# Patient Record
Sex: Male | Born: 1985 | Race: White | Hispanic: No | Marital: Single | Smoking: Never smoker
Health system: Southern US, Community
[De-identification: ages and names within clinical notes are randomized; demographics above are authoritative.]

## PROBLEM LIST (undated history)

## (undated) HISTORY — PX: OTHER SURGICAL HISTORY: SHX169

## (undated) HISTORY — PX: NEPHRECTOMY: SHX65

---

## 2005-12-29 ENCOUNTER — Emergency Department (HOSPITAL_COMMUNITY): Admission: EM | Admit: 2005-12-29 | Discharge: 2005-12-30 | Payer: Self-pay | Admitting: Emergency Medicine

## 2016-08-05 ENCOUNTER — Ambulatory Visit: Payer: Self-pay | Admitting: Family Medicine

## 2016-08-25 ENCOUNTER — Ambulatory Visit: Payer: Self-pay | Admitting: Family Medicine

## 2016-10-26 ENCOUNTER — Emergency Department (HOSPITAL_COMMUNITY)
Admission: EM | Admit: 2016-10-26 | Discharge: 2016-10-26 | Disposition: A | Discharge status: Home / Self Care | Payer: BLUE CROSS/BLUE SHIELD | Attending: Emergency Medicine | Admitting: Emergency Medicine

## 2016-10-26 ENCOUNTER — Encounter (HOSPITAL_COMMUNITY): Payer: Self-pay | Admitting: Emergency Medicine

## 2016-10-26 ENCOUNTER — Emergency Department (HOSPITAL_COMMUNITY): Payer: BLUE CROSS/BLUE SHIELD

## 2016-10-26 DIAGNOSIS — X500XXA Overexertion from strenuous movement or load, initial encounter: Secondary | ICD-10-CM | POA: Diagnosis not present

## 2016-10-26 DIAGNOSIS — Y92310 Basketball court as the place of occurrence of the external cause: Secondary | ICD-10-CM | POA: Insufficient documentation

## 2016-10-26 DIAGNOSIS — S99912A Unspecified injury of left ankle, initial encounter: Secondary | ICD-10-CM | POA: Diagnosis present

## 2016-10-26 DIAGNOSIS — Y9367 Activity, basketball: Secondary | ICD-10-CM | POA: Insufficient documentation

## 2016-10-26 DIAGNOSIS — S86012A Strain of left Achilles tendon, initial encounter: Secondary | ICD-10-CM

## 2016-10-26 DIAGNOSIS — Y998 Other external cause status: Secondary | ICD-10-CM | POA: Diagnosis not present

## 2016-10-26 MED ORDER — HYDROCODONE-ACETAMINOPHEN 5-325 MG PO TABS
1.0000 | ORAL_TABLET | Freq: Once | ORAL | Status: AC
  Administered 2016-10-26: 1 via ORAL
  Filled 2016-10-26: qty 1

## 2016-10-26 MED ORDER — ONDANSETRON 4 MG PO TBDP
4.0000 mg | ORAL_TABLET | Freq: Once | ORAL | Status: AC
  Administered 2016-10-26: 4 mg via ORAL
  Filled 2016-10-26: qty 1

## 2016-10-26 MED ORDER — ONDANSETRON HCL 4 MG PO TABS: 4.0000 mg | ORAL_TABLET | Freq: Four times a day (QID) | ORAL | 0 refills | Status: DC

## 2016-10-26 MED ORDER — MORPHINE SULFATE (PF) 4 MG/ML IV SOLN
4.0000 mg | Freq: Once | INTRAVENOUS | Status: AC
  Administered 2016-10-26: 4 mg via INTRAMUSCULAR
  Filled 2016-10-26: qty 1

## 2016-10-26 MED ORDER — HYDROCODONE-ACETAMINOPHEN 5-325 MG PO TABS: 1.0000 | ORAL_TABLET | ORAL | 0 refills | Status: DC | PRN

## 2016-10-26 MED ORDER — OXYCODONE-ACETAMINOPHEN 5-325 MG PO TABS
1.0000 | ORAL_TABLET | Freq: Once | ORAL | Status: DC
Start: 2016-10-26 — End: 2016-10-26

## 2016-10-26 NOTE — Discharge Instructions (Signed)
You may take the Norco as needed for severe pain. He may alternate with ibuprofen as needed (200-200-400 mg).  Keep ankle in splint, and use crutches. Do not put weight on her leg. Follow-up with orthopedics in the next 1-2 days. Return to the emergency department if he started to experience severe worsening of pain or loss of feeling in your foot.

## 2016-10-26 NOTE — Progress Notes (Signed)
Orthopedic Tech Progress Note Patient Details:  Aaron MohsSteven Fields 02/13/1986 [id number]  Ortho Devices Type of Ortho Device: Ace wrap, Post (short leg) splint Ortho Device/Splint Location: LLE Ortho Device/Splint Interventions: Ordered, Application   [name], [name] 10/26/2016, 9:23 PM

## 2016-10-26 NOTE — ED Provider Notes (Signed)
[facility]EMER[facility]ENCY DEPT Provider Note   CSN: [id number] Arrival date & time: 10/26/16  1907  .By signing my name below, I, Freida Busman, attest that this documentation has been prepared under the direction and in the presence of  Thia Olesen PA-C. Electronically Signed: Freida Busman, Scribe. 10/26/2016. 7:35 PM.  History   Chief Complaint Chief Complaint  Patient presents with  . Ankle Pain    The history is provided by the patient. No language interpreter was used.   HPI Comments:  Aaron Fields is a 31 y.o. male who presents to the Emergency Department complaining of moderate-severe, constant left ankle pain that began just PTA. He was playing basketball; pushed off the ground to jump and heard a very loud pop. He states his pain radiates up the left calf. He reports associated numbness/tingling in the left foot, but cannot identify where. No alleviating factors noted; no treatments tried PTA. No recent use of antibiotics or steroids. No injury or pain elsewhere.  History reviewed. No pertinent past medical history.  There are no active problems to display for this patient.   Past Surgical History:  Procedure Laterality Date  . heminephrectomy         Home Medications    Prior to Admission medications   Medication Sig Start Date End Date Taking? Authorizing Provider  HYDROcodone-acetaminophen (NORCO/VICODIN) 5-325 MG tablet Take 1-2 tablets by mouth every 4 (four) hours as needed for severe pain. 10/26/16   Theda Payer, PA-C  ondansetron (ZOFRAN) 4 MG tablet Take 1 tablet (4 mg total) by mouth every 6 (six) hours. 10/26/16   Kiandria Clum, PA-C    Family History History reviewed. No pertinent family history.  Social History Social History  Substance Use Topics  . Smoking status: Never Smoker  . Smokeless tobacco: Not on file  . Alcohol use No     Comment: occassional     Allergies   Ceclor [cefaclor]   Review of Systems Review of Systems    Musculoskeletal: Positive for arthralgias and myalgias.  Neurological: Negative for weakness.     Physical Exam Updated Vital Signs BP 136/80 (BP Location: Right Arm)   Pulse 72   Temp 98.2 F (36.8 C) (Oral)   Resp 16   Ht 6' (1.829 m)   Wt 108.9 kg (240 lb)   SpO2 100%   BMI 32.55 kg/m   Physical Exam  Constitutional: He is oriented to person, place, and time. He appears well-developed and well-nourished. No distress.  HENT:  Head: Normocephalic and atraumatic.  Eyes: Conjunctivae are normal.  Cardiovascular: Normal rate.   Pulmonary/Chest: Effort normal.  Abdominal: He exhibits no distension.  Musculoskeletal:  Minimal swelling of L ankle. No obsvious contusions or laceration. TTP of L calf with increased tenderness and a bulge about 5 in above the ankle. No palpable L achilles tendon, obvious palpable right achilles tendon.  Sensation intact bilaterally. Pulses intact bilaterally. Pedal color and warmth equal.   Neurological: He is alert and oriented to person, place, and time.  Skin: Skin is warm and dry.  Psychiatric: He has a normal mood and affect.  Nursing note and vitals reviewed.    ED Treatments / Results  DIAGNOSTIC STUDIES:  Oxygen Saturation is 98% on RA, normal by my interpretation.    COORDINATION OF CARE:  7:29 PM Discussed treatment plan with pt at bedside and pt agreed to plan.  Labs (all labs ordered are listed, but only abnormal results are displayed) Labs Reviewed - No data to  display  EKG  EKG Interpretation None       Radiology Dg Ankle Complete Left  Result Date: 10/26/2016 CLINICAL DATA:  Basketball injury EXAM: LEFT ANKLE COMPLETE - 3+ VIEW COMPARISON:  None. FINDINGS: There is moderate soft tissue edema at the left ankle that is greatest posteriorly, in the region of the Achilles tendon. There is no fracture or dislocation. IMPRESSION: Posterior left ankle soft tissue swelling, near the Achilles tendon. No fracture or  dislocation. Electronically Signed   By: Deatra RobinsonKevin  Herman M.D.   On: 10/26/2016 20:03    Procedures Procedures (including critical care time)  Medications Ordered in ED Medications  ondansetron (ZOFRAN-ODT) disintegrating tablet 4 mg (4 mg Oral Given 10/26/16 2005)  morphine 4 MG/ML injection 4 mg (4 mg Intramuscular Given 10/26/16 2004)  HYDROcodone-acetaminophen (NORCO/VICODIN) 5-325 MG per tablet 1 tablet (1 tablet Oral Given 10/26/16 2057)     Initial Impression / Assessment and Plan / ED Course  I have reviewed the triage vital signs and the nursing notes.  Pertinent labs & imaging results that were available during my care of the patient were reviewed by me and considered in my medical decision making (see chart for details).     History and physcial concerning for achilles tendon rupture. Pain is superior to the insertion site, and no tendon felts on the left side. Xray showed no bony involvement. Will manage pn in ED, apply posterior splint, and have pt follow up with ortho. Pt NWB with crutches. Pain is slightly improved with morphine, but patient reports he is still in a significant amount of pain. Will give 1 dose of Norco here prior to discharge. [place] checked, and patient has no prescriptions in the past year for opiates. Return precautions given, and patient agrees to plan.   Final Clinical Impressions(s) / ED Diagnoses   Final diagnoses:  Rupture of left Achilles tendon, initial encounter    New Prescriptions Discharge Medication List as of 10/26/2016  8:25 PM     I personally performed the services described in this documentation, which was scribed in my presence. The recorded information has been reviewed and is accurate.     Alveria ApleyCaccavale, Nikolay Demetriou, PA-C 10/26/16 2204    Gerhard MunchLockwood, Robert, MD 10/27/16 [phone]

## 2016-10-26 NOTE — ED Triage Notes (Signed)
Pt presents with pain to L ankle while playing basketball, felt/heard a loud "pop"; cannot bear weight

## 2017-01-15 ENCOUNTER — Emergency Department (HOSPITAL_COMMUNITY): Payer: BLUE CROSS/BLUE SHIELD

## 2017-01-15 ENCOUNTER — Emergency Department (HOSPITAL_COMMUNITY)
Admission: EM | Admit: 2017-01-15 | Discharge: 2017-01-15 | Disposition: A | Discharge status: Home / Self Care | Payer: BLUE CROSS/BLUE SHIELD | Attending: Emergency Medicine | Admitting: Emergency Medicine

## 2017-01-15 ENCOUNTER — Encounter (HOSPITAL_COMMUNITY): Payer: Self-pay

## 2017-01-15 DIAGNOSIS — J181 Lobar pneumonia, unspecified organism: Secondary | ICD-10-CM | POA: Diagnosis not present

## 2017-01-15 DIAGNOSIS — J189 Pneumonia, unspecified organism: Secondary | ICD-10-CM

## 2017-01-15 DIAGNOSIS — R0600 Dyspnea, unspecified: Secondary | ICD-10-CM | POA: Diagnosis not present

## 2017-01-15 DIAGNOSIS — R071 Chest pain on breathing: Secondary | ICD-10-CM | POA: Diagnosis present

## 2017-01-15 LAB — BASIC METABOLIC PANEL
ANION GAP: 9 (ref 5–15)
BUN: 13 mg/dL (ref 6–20)
CALCIUM: 9.2 mg/dL (ref 8.9–10.3)
CO2: 24 mmol/L (ref 22–32)
Chloride: 104 mmol/L (ref 101–111)
Creatinine, Ser: 0.93 mg/dL (ref 0.61–1.24)
GFR calc non Af Amer: >60 mL/min (ref >60)
Glucose, Bld: 93 mg/dL (ref 65–99)
POTASSIUM: 3.7 mmol/L (ref 3.5–5.1)
Sodium: 137 mmol/L (ref 135–145)

## 2017-01-15 LAB — I-STAT TROPONIN, ED: TROPONIN I, POC: 0 ng/mL (ref 0.00–0.08)

## 2017-01-15 LAB — CBC WITH DIFFERENTIAL/PLATELET
BASOS ABS: 0 10*3/uL (ref 0.0–0.1)
BASOS PCT: 0 %
Eosinophils Absolute: 0.2 10*3/uL (ref 0.0–0.7)
Eosinophils Relative: 2 %
HCT: 40.5 % (ref 39.0–52.0)
HEMOGLOBIN: 13.8 g/dL (ref 13.0–17.0)
LYMPHS PCT: 17 %
Lymphs Abs: 1.4 10*3/uL (ref 0.7–4.0)
MCH: 30.1 pg (ref 26.0–34.0)
MCHC: 34.1 g/dL (ref 30.0–36.0)
MCV: 88.2 fL (ref 78.0–100.0)
MONO ABS: 0.8 10*3/uL (ref 0.1–1.0)
Monocytes Relative: 10 %
NEUTROS ABS: 5.9 10*3/uL (ref 1.7–7.7)
NEUTROS PCT: 71 %
Platelets: 125 10*3/uL — ABNORMAL LOW (ref 150–400)
RBC: 4.59 MIL/uL (ref 4.22–5.81)
RDW: 12.6 % (ref 11.5–15.5)
WBC: 8.3 10*3/uL (ref 4.0–10.5)

## 2017-01-15 LAB — TROPONIN I: Troponin I: <0.03 ng/mL (ref <0.03)

## 2017-01-15 MED ORDER — AZITHROMYCIN 250 MG PO TABS: 250.0000 mg | ORAL_TABLET | Freq: Every day | ORAL | 0 refills | Status: DC

## 2017-01-15 MED ORDER — HYDROCODONE-ACETAMINOPHEN 5-325 MG PO TABS: 1.0000 | ORAL_TABLET | ORAL | 0 refills | Status: DC | PRN

## 2017-01-15 MED ORDER — IOPAMIDOL (ISOVUE-370) INJECTION 76%
INTRAVENOUS | Status: AC
Start: 2017-01-15 — End: 2017-01-15
  Administered 2017-01-15: 100 mL
  Filled 2017-01-15: qty 100

## 2017-01-15 MED ORDER — SODIUM CHLORIDE 0.9 % IV SOLN: INTRAVENOUS | Status: DC

## 2017-01-15 NOTE — ED Provider Notes (Signed)
[facility] Provider Note   CSN: [id number] Arrival date & time: 01/15/17  7829     History   Chief Complaint Chief Complaint  Patient presents with  . Chest Pain    HPI Aaron Fields is a 31 y.o. male.  31 year old male presents with several days of left-sided chest pain characterized as pleuritic and worse with taking a deep breath now with some mild hemoptysis 1. Has noted increasing dyspnea on exertion. Does have a history of left Achilles tendon rupture which is being treated nonsurgically with a cast boot. Denies any fever or chills. Pain is been persistent and medicated at home with hydrocodone. Denies any prior history of pulmonary embolism. Does not take any blood thinners currently. Denies any new leg pain or swelling.      History reviewed. No pertinent past medical history.  There are no active problems to display for this patient.   Past Surgical History:  Procedure Laterality Date  . heminephrectomy         Home Medications    Prior to Admission medications   Medication Sig Start Date End Date Taking? Authorizing Provider  HYDROcodone-acetaminophen (NORCO/VICODIN) 5-325 MG tablet Take 1-2 tablets by mouth every 4 (four) hours as needed for severe pain. 10/26/16   Caccavale, Sophia, PA-C  ondansetron (ZOFRAN) 4 MG tablet Take 1 tablet (4 mg total) by mouth every 6 (six) hours. 10/26/16   Caccavale, Sophia, PA-C    Family History No family history on file.  Social History Social History  Substance Use Topics  . Smoking status: Never Smoker  . Smokeless tobacco: Never Used  . Alcohol use No     Comment: occassional     Allergies   Ceclor [cefaclor]   Review of Systems Review of Systems  All other systems reviewed and are negative.    Physical Exam Updated Vital Signs BP (!) 142/90   Pulse 84   Temp 98 F (36.7 C) (Oral)   Resp 18   Ht 1.854 m (6\' 1" )   Wt 106.6 kg (235 lb)   SpO2 97%   BMI 31.00 kg/m   Physical Exam    Constitutional: He is oriented to person, place, and time. He appears well-developed and well-nourished.  Non-toxic appearance. No distress.  HENT:  Head: Normocephalic and atraumatic.  Eyes: Pupils are equal, round, and reactive to light. Conjunctivae, EOM and lids are normal.  Neck: Normal range of motion. Neck supple. No tracheal deviation present. No thyroid mass present.  Cardiovascular: Normal rate, regular rhythm and normal heart sounds.  Exam reveals no gallop.   No murmur heard. Pulmonary/Chest: Effort normal and breath sounds normal. No stridor. No respiratory distress. He has no decreased breath sounds. He has no wheezes. He has no rhonchi. He has no rales.  Abdominal: Soft. Normal appearance and bowel sounds are normal. He exhibits no distension. There is no tenderness. There is no rebound and no CVA tenderness.  Musculoskeletal: Normal range of motion. He exhibits no edema or tenderness.       Legs: Neurological: He is alert and oriented to person, place, and time. He has normal strength. No cranial nerve deficit or sensory deficit. GCS eye subscore is 4. GCS verbal subscore is 5. GCS motor subscore is 6.  Skin: Skin is warm and dry. No abrasion and no rash noted.  Psychiatric: He has a normal mood and affect. His speech is normal and behavior is normal.  Nursing note and vitals reviewed.    ED Treatments /  Results  Labs (all labs ordered are listed, but only abnormal results are displayed) Labs Reviewed  CBC WITH DIFFERENTIAL/PLATELET  BASIC METABOLIC PANEL  TROPONIN I    EKG  EKG Interpretation  Date/Time:  Sunday January 15 2017 07:33:25 EDT Ventricular Rate:  84 PR Interval:  146 QRS Duration: 92 QT Interval:  358 QTC Calculation: 423 R Axis:   70 Text Interpretation:  Normal sinus rhythm Normal ECG Confirmed by Lorre NickAllen, Peyton Spengler ([id number]) on 01/15/2017 7:39:42 AM       Radiology No results found.  Procedures Procedures (including critical care  time)  Medications Ordered in ED Medications  0.9 %  sodium chloride infusion (not administered)     Initial Impression / Assessment and Plan / ED Course  I have reviewed the triage vital signs and the nursing notes.  Pertinent labs & imaging results that were available during my care of the patient were reviewed by me and considered in my medical decision making (see chart for details).     Chest CT consistent with pneumonia on the left side. This would explain the patient's symptoms. We placed on antibiotics as well as analgesics and return precautions given.  Final Clinical Impressions(s) / ED Diagnoses   Final diagnoses:  None    New Prescriptions New Prescriptions   No medications on file     Lorre NickAllen, Jenan Ellegood, MD 01/15/17 1120

## 2017-01-15 NOTE — ED Triage Notes (Addendum)
Patient complains of left anterior CP with radiation to left shoulder x 2 days. Reports worse with inspiration and movement. Reports hemoptysis of dark blood this am, NAD

## 2017-10-24 ENCOUNTER — Ambulatory Visit: Payer: Self-pay | Admitting: General Surgery

## 2018-05-06 IMAGING — CT CT ANGIO CHEST
2 of 6 series · 18 of 36 positions shown · IV contrast (Omni 300)
Comparison: None.

CLINICAL DATA: Patient complains of left anterior CP with radiation
to left shoulder x 2 days, this morning pt Abela that he coughed dark
blood.

EXAM:
CT ANGIOGRAPHY CHEST WITH CONTRAST
TECHNIQUE: Multidetector CT imaging of the chest was performed using the
standard protocol during bolus administration of intravenous
contrast. Multiplanar CT image reconstructions and MIPs were
obtained to evaluate the vascular anatomy.
CONTRAST:  100 mL Isovue 370 IV

[Series 7: pe thins · axial · 0.84mm/px · z∈[+1167,+1440]mm · 17 of 309 slices shown]
[im 18/309  lung]
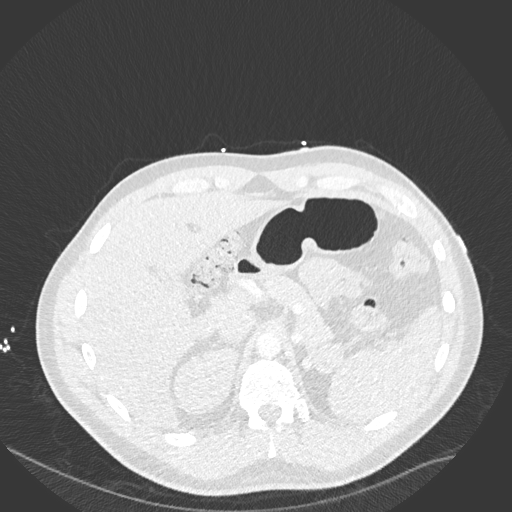
[im 35/309  mediastinal]
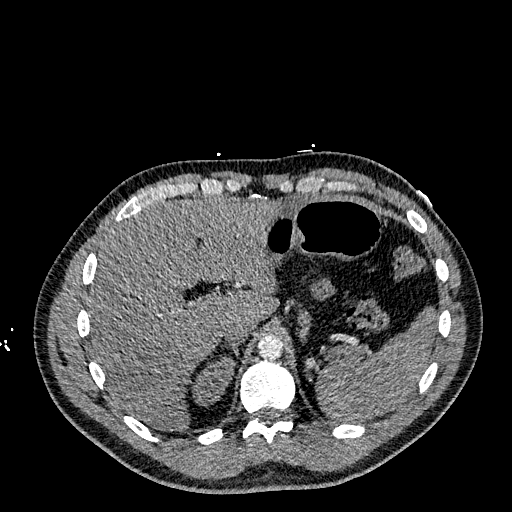
[im 52/309  lung]
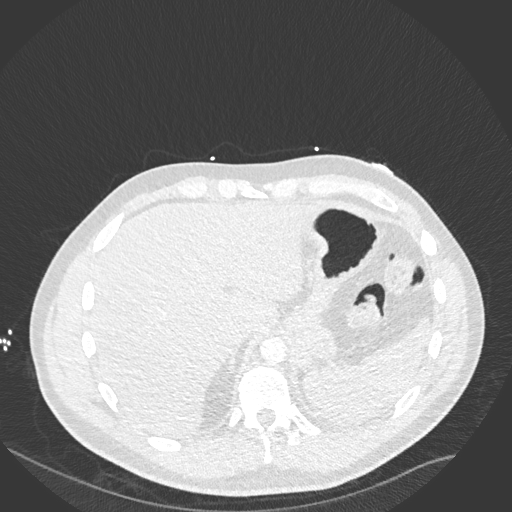
[im 69/309  mediastinal]
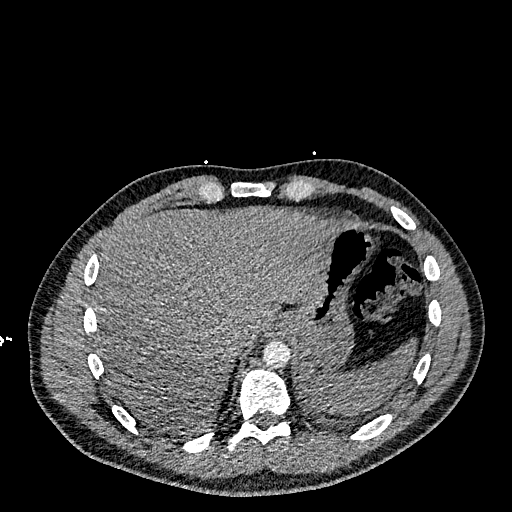
[im 86/309  lung]
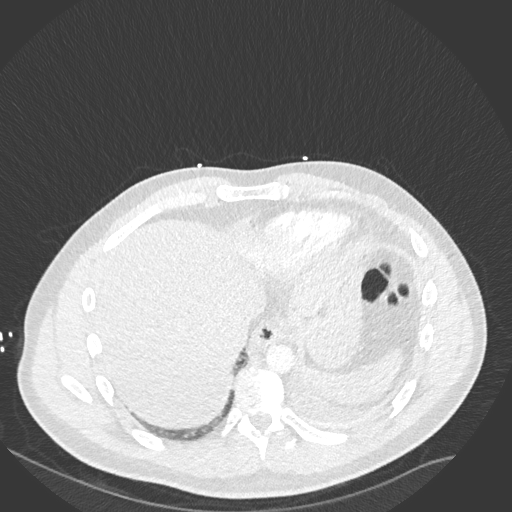
[im 103/309  mediastinal]
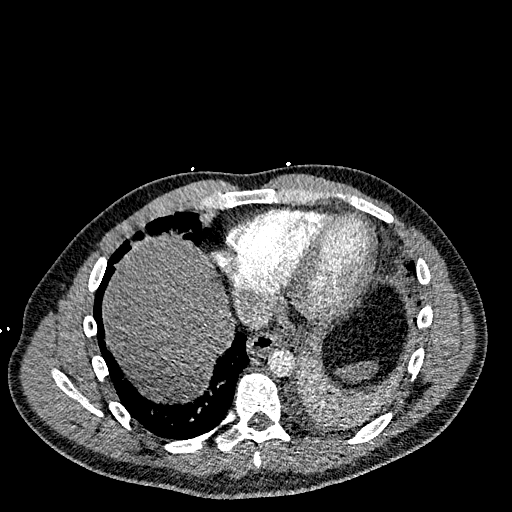
[im 120/309  lung]
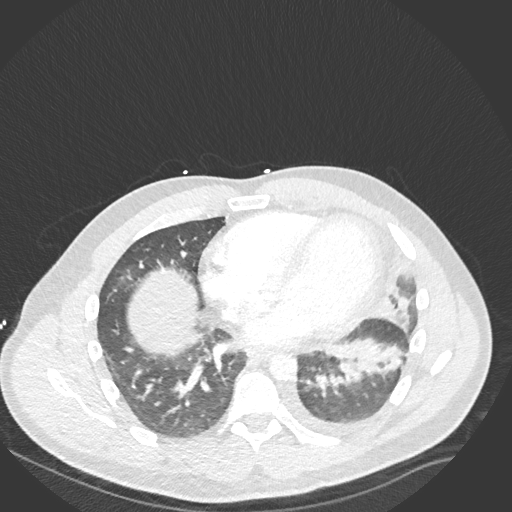
[im 137/309  mediastinal]
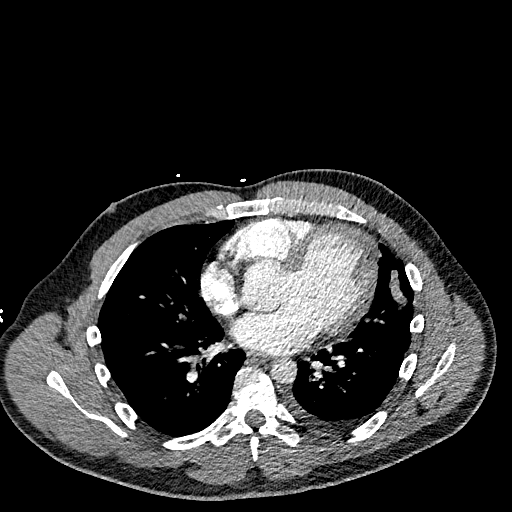
[im 155/309  lung]
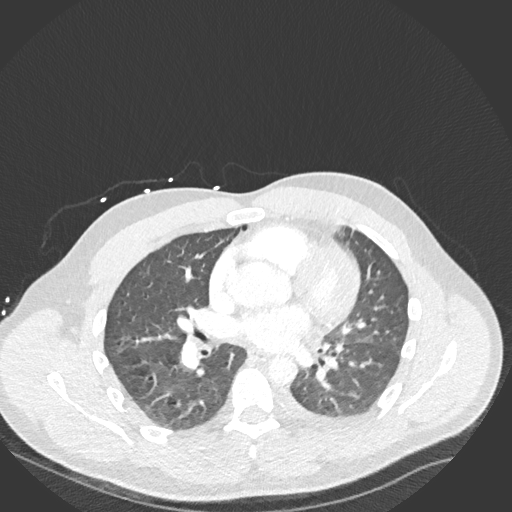
[im 172/309  mediastinal]
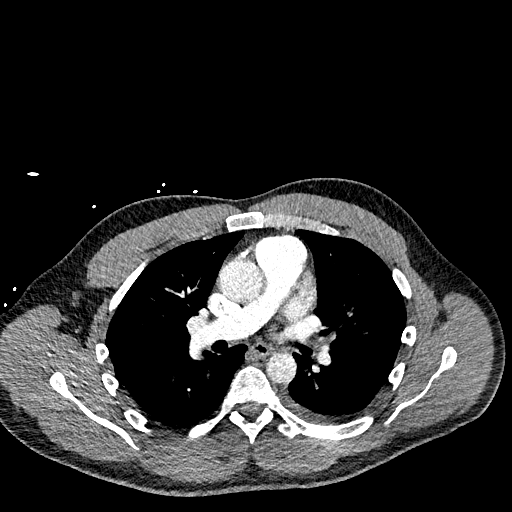
[im 189/309  lung]
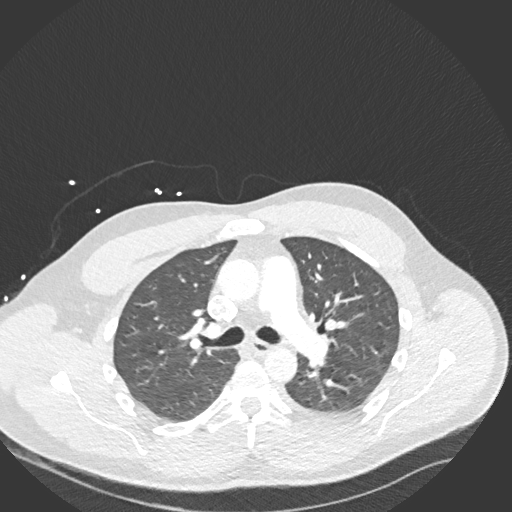
[im 206/309  mediastinal]
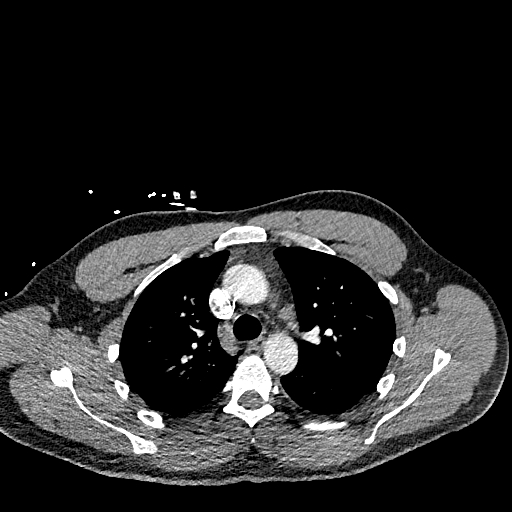
[im 223/309  lung]
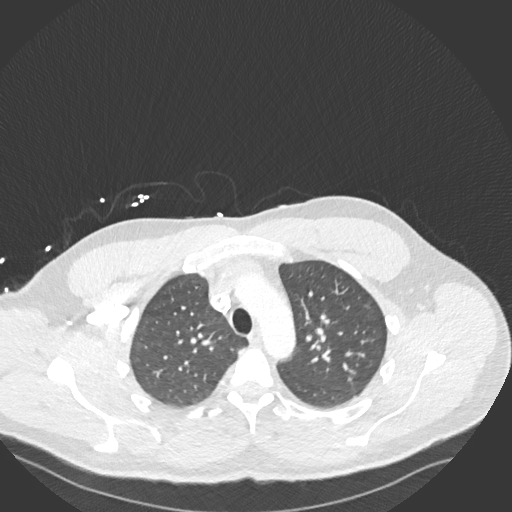
[im 240/309  mediastinal]
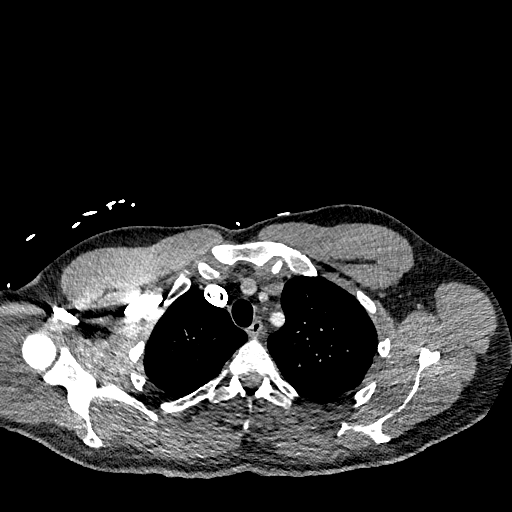
[im 257/309  lung]
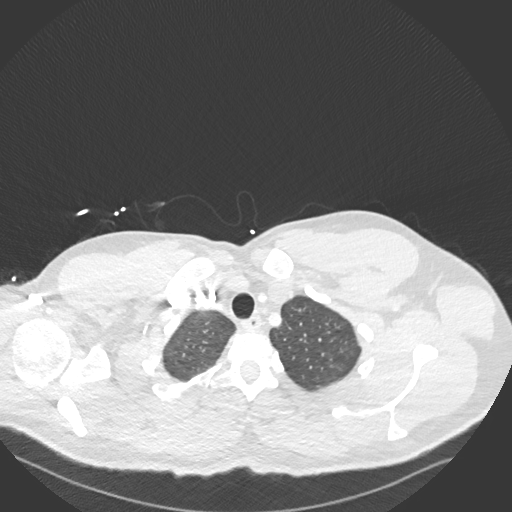
[im 274/309  mediastinal]
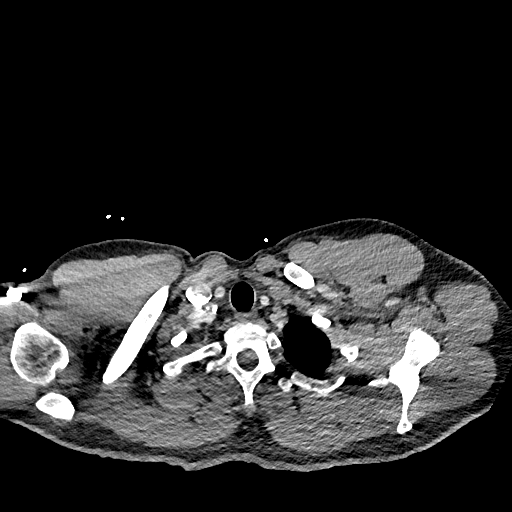
[im 291/309  lung]
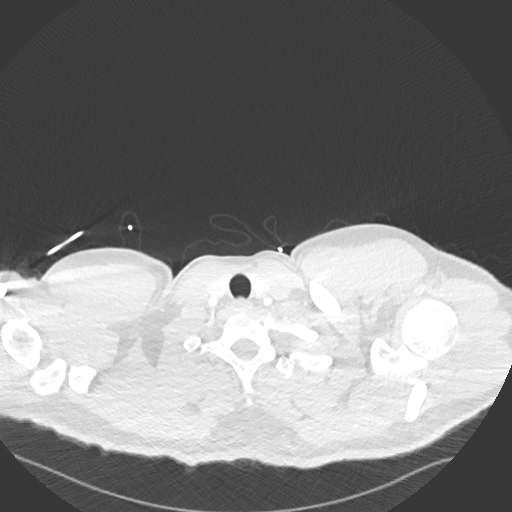

[Series 8: pe 2mm cor · coronal · 0.60mm/px · 1 of 119 slices shown]
[im 60/119  mediastinal]
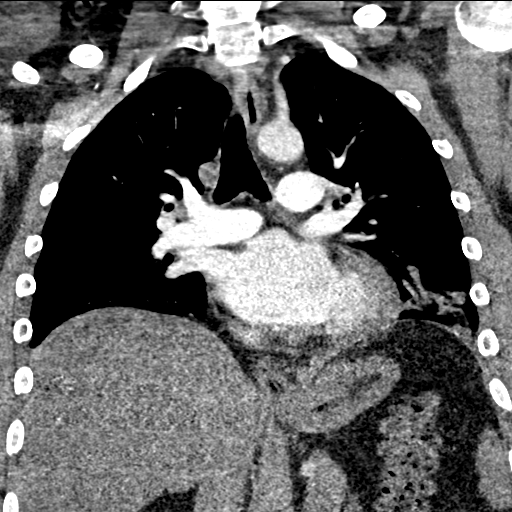

[18 of 36 positions shown; findings below may reference images not displayed]

FINDINGS: Cardiovascular: Right atrium is nondilated. Satisfactory
opacification of pulmonary arteries noted, and there is no evidence
of pulmonary emboli. Breathing motion during the acquisition
degrades some of the images.

Adequate contrast opacification of the thoracic aorta with no
evidence of dissection, aneurysm, or stenosis. There is classic
3-vessel brachiocephalic arch anatomy without proximal stenosis. No
significant atheromatous plaque. No pericardial effusion.

Mediastinum/Nodes: No enlarged mediastinal, hilar, or axillary lymph
nodes. Thyroid gland, trachea, and esophagus demonstrate no
significant findings.

Lungs/Pleura: Small left pleural effusion. Patchy airspace
consolidation in the inferior lingula. Subsegmental consolidation/
atelectasis in the lateral and posterior basal segments left lower
lobe. Right lung clear. No pneumothorax.

Upper Abdomen: No acute abnormality.

Musculoskeletal: No chest wall abnormality. No acute or significant
osseous findings.

Review of the MIP images confirms the above findings.
IMPRESSION: 1. No evidence of acute PE or thoracic aortic dissection.
2. Patchy airspace disease in the inferior lingula and left lower
lobe with small effusion.

## 2020-09-03 ENCOUNTER — Ambulatory Visit (INDEPENDENT_AMBULATORY_CARE_PROVIDER_SITE_OTHER): Payer: BC Managed Care – PPO | Admitting: Urology

## 2020-09-03 ENCOUNTER — Encounter: Payer: Self-pay | Admitting: Urology

## 2020-09-03 ENCOUNTER — Other Ambulatory Visit: Payer: Self-pay

## 2020-09-03 VITALS — BP 125/91 | HR 78 | Ht 73.0 in | Wt 262.7 lb

## 2020-09-03 DIAGNOSIS — N289 Disorder of kidney and ureter, unspecified: Secondary | ICD-10-CM

## 2020-09-03 DIAGNOSIS — Z3009 Encounter for other general counseling and advice on contraception: Secondary | ICD-10-CM | POA: Diagnosis not present

## 2020-09-03 NOTE — Progress Notes (Signed)
09/03/20 8:32 AM   Aaron Fields [birth year] [id number]  CC: Left flank pain, history of left heminephrectomy and left orchiectomy  HPI: I saw [name] for the above issues.  He has a interesting and complex urologic history.  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  Those records are unavailable to me.  He ultimately underwent a open left heminephrectomy through a large flank incision about a year later, and has done well since that time.  He denies any history of kidney stones.  He had 1 episode of gross hematuria playing football in college.  He denies any urinary symptoms.  He has had some dull flank pain and achiness on the left side and is interested in further work-up.  He was told to get a yearly IVP by his original urologist.  He also has a history of a left undescended testicle and underwent a orchiectomy as a child.  He is married with 1 child who is 28-1/2 years old, and his wife is currently pregnant.  He is interested in pursuing vasectomy if this child is healthy.  He also had some questions about a testicular prosthesis.  Renal function is normal with creatinine of 1.14, EGFR greater than 60, and urinalysis today is completely benign.  Surgical History: Past Surgical History:  Procedure Laterality Date  . heminephrectomy     Social History:  reports that he has never smoked. He has never used smokeless tobacco. He reports that he does not drink alcohol and does not use drugs.  Physical Exam: BP (!) 125/91 (BP Location: Left Arm, Patient Position: Sitting, Cuff Size: Large)   Pulse 78   Ht 6' 1"  (1.854 m)   Wt 262 lb 11.2 oz (119.2 kg)   BMI 34.66 kg/m    Constitutional:  Alert and oriented, No acute distress. Cardiovascular: No clubbing, cyanosis, or  edema. Respiratory: Normal respiratory effort, no increased work of breathing. 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  Renal function is normal with creatinine of 1.14, and urinalysis today is completely benign.  He has had 2 to 3 weeks of dull left flank pain, and was also told to get yearly imaging with his history of heminephrectomy, and he is interested in further evaluation.  He is also interested in considering vasectomy for sterilization. We discussed the risks and benefits of vasectomy at length.  He deferred GU exam today.  Vasectomy is intended to be a permanent form of contraception, and does not produce immediate sterility.  Following vasectomy another form of contraception is required until vas occlusion is confirmed by a post-vasectomy semen analysis obtained 2-3 months after the procedure.  Even after vas occlusion is confirmed, vasectomy is not 100% reliable in preventing pregnancy, and the failure rate is approximately 05/1998.  Repeat vasectomy is required in less than 1% of patients.  He should refrain from ejaculation for 1 week after vasectomy.  Options for fertility after vasectomy include vasectomy reversal, and sperm retrieval with in vitro fertilization or ICSI.  These options are not always successful  and may be expensive.  Finally, there are other permanent and non-permanent alternatives to vasectomy available. There is no risk of erectile dysfunction, and the volume of semen will be similar to prior, as the majority of the ejaculate is from the prostate and seminal vesicles. The procedure takes ~20 minutes.  We recommend patients take 5-10 mg of Valium 30 minutes prior, and  he will need a driver post-procedure.  Local anesthetic is injected into the scrotal skin and a small segment of the vas deferens is removed, and the ends occluded. The complication rate is approximately 1-2%, and includes bleeding, infection, and development of chronic scrotal pain.  Finally, we discussed the risks and benefits of a testicular prosthesis.  We discussed the risk of chronic pain and infection.  He does not seem to be particularly bothered by this at this time, but he will think more about it.    Will call with CT urogram results Consider vasectomy in the future per patient preference, will still need testicular exam to confirm palpable  Aaron Madrid, MD 09/03/2020  [facility] [address] [place], [place] [address] [phone]

## 2020-09-03 NOTE — Patient Instructions (Addendum)
Pre-Vasectomy Instructions  STOP all aspirin or blood thinners (Aspirin, Plavix, Coumadin, Warfarin, Motrin, Ibuprofen, Advil, Aleve, Naproxen, Naprosyn) for 7 days prior to the procedure.  If you have any questions about stopping these medications please contact your primary care physician or cardiologist.  Shave all hair from the upper scrotum on the day of the procedure.  This means just under the penis onto the scrotal sac.  The area shaved should measure about 2-3 inches around.  You may lather the scrotum with soap and water, and shave with a safety razor.  After shaving the area, thoroughly wash the penis and the scrotum, then shower or bathe to remove all the loose hairs.  If needed, wash the area again just before coming in for your circumcision.  It is recommended to have a light meal an hour or so prior to the procedure.  Bring a scrotal support (jock strap or suspensory, or tight jockey shorts or underwear).  Wear comfortable pants or shorts.  While the actual procedure usually takes about 45 minutes, you should be prepared to stay in the office for approximately one hour.  Bring someone with you to drive you home.  If you have any questions or concerns, please feel free to call the office at [phone].    Vasectomy Vasectomy is a procedure in which the vas deferens is cut and then tied or burned (cauterized). The vas deferens is a tube that carries sperm from the testicle to the part of the body that drains urine from the bladder (urethra). This procedure blocks sperm from going through the vas deferens and penis during ejaculation. This ensures that sperm does not go into the vagina during sex. Vasectomy does not affect sexual desire or performance and does not prevent sexually transmitted infections. Vasectomy is considered a permanent and very effective form of birth control (contraception). The decision to have a vasectomy should not be made during a stressful time, such as  after the loss of a pregnancy or a divorce. You and your partner should decide on whether to have a vasectomy when you are sure that you do not want children in the future. Tell a health care provider about:  Any allergies you have.  All medicines you are taking, including vitamins, herbs, eye drops, creams, and over-the-counter medicines.  Any problems you or family members have had with anesthetic medicines.  Any blood disorders you have.  Any surgeries you have had.  Any medical conditions you have. What are the risks? Generally, this is a safe procedure. However, problems may occur, including:  Infection.  Bleeding and swelling of the scrotum. The scrotum is the sac that contains the testicles, blood vessels, and structures that help deliver sperm and semen.  Allergic reactions to medicines.  Failure of the procedure to prevent pregnancy. There is a very small chance that the tied or cauterized ends of the vas deferens may reconnect (recanalization). If this happens, you could still make a woman pregnant.  Pain in the scrotum that continues after you heal from the procedure. What happens before the procedure? Medicines  Ask your health care provider about: ? Changing or stopping your regular medicines. This is especially important if you are taking diabetes medicines or blood thinners. ? Taking medicines such as aspirin and ibuprofen. These medicines can thin your blood. Do not take these medicines unless your health care provider tells you to take them. ? Taking over-the-counter medicines, vitamins, herbs, and supplements.  You may be told to  take a medicine to help you relax (sedative) a few hours before the procedure. General instructions  Do not use any products that contain nicotine or tobacco for at least 4 weeks before the procedure. These products include cigarettes, e-cigarettes, and chewing tobacco. If you need help quitting, ask your health care provider.  Plan  to have a responsible adult take you home from the hospital or clinic.  If you will be going home right after the procedure, plan to have a responsible adult care for you for the time you are told. This is important.  Ask your health care provider: ? How your surgery site will be marked. ? What steps will be taken to help prevent infection. These steps may include:  Removing hair at the surgery site.  Washing skin with a germ-killing soap.  Taking antibiotic medicine. What happens during the procedure?  You will be given one or more of the following: ? A sedative, unless you were told to take this a few hours before the procedure. ? A medicine to numb the area (local anesthetic).  Your health care provider will feel, or palpate, for your vas deferens.  To reach the vas deferens, one of two methods may be used: ? A very small incision may be made in your scrotum. ? A punctured opening may be made in your scrotum, without an incision.  Your vas deferens will be pulled out of your scrotum and cut. Then, the vas deferens will be closed in one of two ways: ? Tied at the ends. ? Cauterized at the ends to seal them off.  The vas deferens will be put back into your scrotum.  The incision or puncture opening will be closed with absorbable stitches (sutures). The sutures will eventually dissolve and will not need to be removed after the procedure.  The procedure will be repeated on the other side of your scrotum. The procedure may vary among health care providers and hospitals.   What happens after the procedure?  You will be monitored to make sure that you do not have problems.  You will be asked not to ejaculate for at least 1 week after the procedure, or for as long as you are told.  You will need to use a different form of contraception for 2-4 months after the procedure, until you have test results confirming that there are no sperm in your semen.  You may be given scrotal  support to wear, such as a jockstrap or underwear with a supportive pouch.  If you were given a sedative during the procedure, it can affect you for several hours. Do not drive or operate machinery until your health care provider says that it is safe. Summary  Vasectomy blocks sperm from being released during ejaculation. This procedure is considered a permanent and very effective form of birth control.  Your scrotum will be numbed with medicine (local anesthetic) for the procedure.  After the procedure, you will be asked not to ejaculate for at least 1 week, or for as long as you are told. You will also need to use a different form of contraception until your test results confirm that there are no sperm in your semen. This information is not intended to replace advice given to you by your health care provider. Make sure you discuss any questions you have with your health care provider. Document Revised: 09/19/2019 Document Reviewed: 09/19/2019 Elsevier Patient Education  2021 [employer].

## 2020-09-04 LAB — URINALYSIS, COMPLETE
Bilirubin, UA: NEGATIVE
Glucose, UA: NEGATIVE
Ketones, UA: NEGATIVE
Leukocytes,UA: NEGATIVE
Nitrite, UA: NEGATIVE
Protein,UA: NEGATIVE
RBC, UA: NEGATIVE
Specific Gravity, UA: <1.005 — ABNORMAL LOW (ref 1.005–1.030)
Urobilinogen, Ur: 0.2 mg/dL (ref 0.2–1.0)
pH, UA: 6 (ref 5.0–7.5)

## 2020-09-04 LAB — MICROSCOPIC EXAMINATION
Bacteria, UA: NONE SEEN
Epithelial Cells (non renal): NONE SEEN /hpf (ref 0–10)
WBC, UA: NONE SEEN /hpf (ref 0–5)

## 2020-10-02 ENCOUNTER — Other Ambulatory Visit: Payer: Self-pay | Admitting: Urology

## 2020-10-19 ENCOUNTER — Telehealth: Payer: Self-pay

## 2020-10-19 NOTE — Telephone Encounter (Signed)
Called pt informed him of the information below. Pt gave verbal understanding. Pt states he will drop disc off this week to be reviewed.

## 2020-10-19 NOTE — Telephone Encounter (Signed)
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  Since it was done at [facility] I am unable to review the actual images, but if he brings a disc by I am happy to look at it myself  Legrand Rams, MD 10/16/2020

## 2023-03-10 DIAGNOSIS — M10072 Idiopathic gout, left ankle and foot: Secondary | ICD-10-CM | POA: Diagnosis not present

## 2024-01-25 DIAGNOSIS — F419 Anxiety disorder, unspecified: Secondary | ICD-10-CM | POA: Diagnosis not present

## 2024-01-26 DIAGNOSIS — R5383 Other fatigue: Secondary | ICD-10-CM | POA: Diagnosis not present

## 2024-01-30 DIAGNOSIS — F909 Attention-deficit hyperactivity disorder, unspecified type: Secondary | ICD-10-CM | POA: Diagnosis not present

## 2024-01-30 DIAGNOSIS — R5383 Other fatigue: Secondary | ICD-10-CM | POA: Diagnosis not present

## 2024-01-30 DIAGNOSIS — F419 Anxiety disorder, unspecified: Secondary | ICD-10-CM | POA: Diagnosis not present

## 2024-02-05 NOTE — Progress Notes (Unsigned)
 New patient visit  Patient: Aaron Fields   DOB: Feb 12, 1986   38 y.o. Male  MRN: [id number] Visit Date: 02/06/2024  Today's healthcare provider: Isaiah DELENA Pepper, MD   Chief Complaint  Patient presents with   New Patient (Initial Visit)    NP/Est Care..Pt wants to discuss mood with focusing and anxiety. ( No all vaccines)   Subjective    Aaron Fields is a 38 y.o. male who presents today as a new patient to establish care.   HPI:  Mood, inattention: - Going on for the past year - Difficulty with sitting down and starting tasks. Hard time focusing on work at hand. 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     02/06/2024    9:37 AM  GAD 7 : Generalized Anxiety Score  Nervous, Anxious, on Edge 1  Control/stop worrying 2  Worry too much - different things 2  Trouble relaxing 1  Restless 0  Easily annoyed or irritable 2  Afraid - awful might happen 2  Total GAD 7 Score 10  Anxiety Difficulty Extremely difficult       02/06/2024    9:36 AM  Depression screen PHQ 2/9  Decreased Interest 2  Down, Depressed, Hopeless 2  PHQ - 2 Score 4  Altered sleeping 1  Tired, decreased energy 3  Change in appetite 2  Feeling bad or failure about yourself  2  Trouble concentrating 3  Moving slowly or fidgety/restless 3  Suicidal thoughts 0  PHQ-9 Score 18  Difficult doing work/chores Somewhat difficult    No past medical history on file. Past Surgical History:  Procedure Laterality Date   heminephrectomy     NEPHRECTOMY     No family status information on file.   No family history on file. Social History   Socioeconomic History   Marital status: Single    Spouse name: Not on file   Number of  children: Not on file   Years of education: Not on file   Highest education level: Not on file  Occupational History   Not on file  Tobacco Use   Smoking status: Never   Smokeless tobacco: Never  Substance and Sexual Activity   Alcohol use: No    Comment: occassional   Drug use: No   Sexual activity: Yes    Birth control/protection: None  Other Topics Concern   Not on file  Social History Narrative   Not on file   Social Drivers of Health   Financial Resource Strain: Not on file  Food Insecurity: Not on file  Transportation Needs: Not on file  Physical Activity: Not on file  Stress: Not on file  Social Connections: Not on file   No outpatient medications prior to visit.   No facility-administered medications prior to visit.   Allergies  Allergen Reactions   Ceclor [Cefaclor] Rash    Reviews of Systems as noted in HPI.      Objective    BP 131/78 (BP Location: Right Arm, Patient Position: Sitting, Cuff Size: Large)   Pulse 75   Resp 16   Ht 6' 1 (1.854 m)   Wt 240 lb (108.9 kg)   SpO2 98%  BMI 31.66 kg/m    Physical Exam Constitutional:      Appearance: Normal appearance.  HENT:     Head: Normocephalic and atraumatic.     Mouth/Throat:     Mouth: Mucous membranes are moist.  Eyes:     Pupils: Pupils are equal, round, and reactive to light.  Cardiovascular:     Rate and Rhythm: Normal rate and regular rhythm.     Heart sounds: Normal heart sounds.  Pulmonary:     Effort: Pulmonary effort is normal.     Breath sounds: Normal breath sounds.  Skin:    General: Skin is warm.  Neurological:     General: No focal deficit present.     Mental Status: He is alert.     Depression Screen    02/06/2024    9:36 AM  PHQ 2/9 Scores  PHQ - 2 Score 4  PHQ- 9 Score 18   No results found for any visits on 02/06/24.  Assessment & Plan      Problem List Items Addressed This Visit       Other   Depression, major, single episode, moderate (HCC) -  Primary   Chronic, uncontrolled. Reports 1 year hx of low energy, trouble concentrating, and anxiety. Previously tried Wellbutrin without improvement. - Will start lexapro  10mg  daily - Will refer to integrated behavioral specialist as below for therapy as well as medication recommendations given concurrent inattention as noted below - Follow up in 1 month  Flowsheet Row Office Visit from 02/06/2024 in [facility]  PHQ-9 Total Score 18         Relevant Medications   escitalopram  (LEXAPRO ) 10 MG tablet   Other Relevant Orders   Amb ref to Integrated Behavioral Health   Inattention   Patient with 1 year hx of difficulty finishing tasks, trouble concentrating, and restlessness. May be related to depression and anxiety. Also considered new diagnosis of ADHD, although no symptoms noted in childhood. - Will refer to behavioral specialist for further evaluation      Relevant Orders   Amb ref to Integrated Behavioral Health   RESOLVED: Immunization due   Relevant Orders   Flu vaccine trivalent PF, 6mos and older(Flulaval,Afluria,Fluarix,Fluzone) (Completed)    Return in about 4 weeks (around 03/05/2024) for Follow Up.      Isaiah DELENA Pepper, MD  [facility] [phone] (phone) [phone] (fax)

## 2024-02-06 ENCOUNTER — Ambulatory Visit

## 2024-02-06 VITALS — BP 131/78 | HR 75 | Resp 16 | Ht 73.0 in | Wt 240.0 lb

## 2024-02-06 DIAGNOSIS — Z23 Encounter for immunization: Secondary | ICD-10-CM | POA: Insufficient documentation

## 2024-02-06 DIAGNOSIS — F321 Major depressive disorder, single episode, moderate: Secondary | ICD-10-CM | POA: Diagnosis not present

## 2024-02-06 DIAGNOSIS — R41840 Attention and concentration deficit: Secondary | ICD-10-CM | POA: Diagnosis not present

## 2024-02-06 MED ORDER — ESCITALOPRAM OXALATE 10 MG PO TABS: 10.0000 mg | ORAL_TABLET | Freq: Every day | ORAL | 3 refills | Status: DC

## 2024-02-06 NOTE — Assessment & Plan Note (Addendum)
 Patient with 1 year hx of difficulty finishing tasks, trouble concentrating, and restlessness. May be related to depression and anxiety. Also considered new diagnosis of ADHD, although no symptoms noted in childhood. - Will refer to behavioral specialist for further evaluation

## 2024-02-06 NOTE — Assessment & Plan Note (Signed)
 Chronic, uncontrolled. Reports 1 year hx of low energy, trouble concentrating, and anxiety. Previously tried Wellbutrin without improvement. - Will start lexapro  10mg  daily - Will refer to integrated behavioral specialist as below for therapy as well as medication recommendations given concurrent inattention as noted below - Follow up in 1 month  Flowsheet Row Office Visit from 02/06/2024 in [facility]  PHQ-9 Total Score 18

## 2024-03-05 ENCOUNTER — Ambulatory Visit

## 2024-03-06 ENCOUNTER — Ambulatory Visit

## 2024-03-06 VITALS — BP 118/88 | HR 71 | Resp 16 | Ht 73.0 in | Wt 248.0 lb

## 2024-03-06 DIAGNOSIS — Z3009 Encounter for other general counseling and advice on contraception: Secondary | ICD-10-CM | POA: Diagnosis not present

## 2024-03-06 DIAGNOSIS — F321 Major depressive disorder, single episode, moderate: Secondary | ICD-10-CM | POA: Diagnosis not present

## 2024-03-06 DIAGNOSIS — R41840 Attention and concentration deficit: Secondary | ICD-10-CM

## 2024-03-06 NOTE — Progress Notes (Addendum)
 Established patient visit   Patient: Aaron Fields   DOB: 19-Sep-1985   38 y.o. Male  MRN: [id number] Visit Date: 03/06/2024  Today's healthcare provider: Isaiah DELENA Pepper, MD   Chief Complaint  Patient presents with   Follow-up    F/u ( No other concerns)   Subjective    HPI  Discussed the use of AI scribe software for clinical note transcription with the patient, who gave verbal consent to proceed.  History of Present Illness Aaron Fields is a 38 year old male who presents with concerns about increased appetite and potential ADHD symptoms.  He started taking Lexapro , which he believes significantly increased his appetite, leading to constant snacking and difficulty managing his weight. He stopped the medication recently due to these side effects, particularly after experiencing increased hunger during an outdoor festival. He has a family history of obesity and is actively trying to manage his weight, which was negatively impacted by the medication.  He has been experiencing symptoms that he believes may be related to ADHD, including difficulty concentrating, a feeling of mental fog, and a perceived decline in his ability to perform tasks at work and home. These symptoms have been present for about a year, coinciding with a recent promotion at work and increased responsibilities. He previously tried Wellbutrin , which did not significantly improve his symptoms. He is concerned about the use of stimulants but is seeking help to improve his focus and productivity. 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  He does not consume alcohol regularly and does not smoke.   Medications: Outpatient Medications Prior to Visit  Medication Sig   [DISCONTINUED] escitalopram  (LEXAPRO ) 10 MG tablet Take 1 tablet (10 mg total) by mouth daily.   No facility-administered medications prior to visit.   Flowsheet Row Office  Visit from 03/06/2024 in [facility]  PHQ-9 Total Score 16    Review of Systems as noted in HPI.      Objective    BP 118/88 (BP Location: Left Arm, Patient Position: Sitting, Cuff Size: Normal)   Pulse 71   Resp 16   Ht 6' 1 (1.854 m)   Wt 248 lb (112.5 kg)   SpO2 99%   BMI 32.72 kg/m    Physical Exam Constitutional:      Appearance: Normal appearance.  HENT:     Head: Normocephalic and atraumatic.     Mouth/Throat:     Mouth: Mucous membranes are moist.  Eyes:     Pupils: Pupils are equal, round, and reactive to light.  Pulmonary:     Effort: Pulmonary effort is normal.  Skin:    General: Skin is warm.  Neurological:     General: No focal deficit present.     Mental Status: He is alert.      No results found for any visits on 03/06/24.  Assessment & Plan     Problem List Items Addressed This Visit       Other   Depression, major, single episode, moderate (HCC)   Chronic, uncontrolled. Reports 1 year hx of low energy, trouble concentrating, and anxiety. Previously tried Wellbutrin  without improvement. Started on lexapro  at last visit without much improvement and intolerable side effect of increased appetite. - Will stop lexapro  for now - Will refer to integrated behavioral specialist for therapy as well as medication recommendations given concurrent inattention as noted below  Flowsheet  Row Office Visit from 03/06/2024 in [facility]  PHQ-9 Total Score 16         Inattention   Patient with 1 year hx of difficulty finishing tasks, trouble concentrating, and restlessness. May be related to depression and anxiety. Given limited improvement with lexapro  and wellbutrin , considering new diagnosis of ADHD. Previous blood work done at [facility] was normal. - Will refer to behavioral specialist for further evaluation - Gave resource for neuropsychiatric testing      Screening and evaluation for vasectomy -  Primary   Patient interested in vasectomy, will send referral to urology for further evaluation.      Relevant Orders   Ambulatory referral to Urology   No follow-ups on file.      Collaboration of Care: Behavioral health specialist  Patient/Guardian was advised Release of Information must be obtained prior to any record release in order to collaborate their care with an outside provider. Patient/Guardian was advised if they have not already done so to contact the registration department to sign all necessary forms in order for us  to release information regarding their care.   Consent: Patient/Guardian gives verbal consent for treatment and assignment of benefits for services provided during this visit. Patient/Guardian expressed understanding and agreed to proceed.    Isaiah DELENA Pepper, MD  [facility] [phone] (phone) [phone] (fax)

## 2024-03-06 NOTE — Assessment & Plan Note (Signed)
 Patient interested in vasectomy, will send referral to urology for further evaluation.

## 2024-03-06 NOTE — Patient Instructions (Addendum)
 ADHD Evaluation: [facility] [phone]?[place], [place] [address] Phone: [phone] Fax: [phone] [email or web address]

## 2024-03-06 NOTE — Assessment & Plan Note (Addendum)
 Patient with 1 year hx of difficulty finishing tasks, trouble concentrating, and restlessness. May be related to depression and anxiety. Given limited improvement with lexapro  and wellbutrin, considering new diagnosis of ADHD. Previous blood work done at [facility] was normal. - Will refer to behavioral specialist for further evaluation - Gave resource for neuropsychiatric testing

## 2024-03-06 NOTE — Assessment & Plan Note (Addendum)
 Chronic, uncontrolled. Reports 1 year hx of low energy, trouble concentrating, and anxiety. Previously tried Wellbutrin without improvement. Started on lexapro  at last visit without much improvement and intolerable side effect of increased appetite. - Will stop lexapro  for now - Will refer to integrated behavioral specialist for therapy as well as medication recommendations given concurrent inattention as noted below  [employer] Visit from 03/06/2024 in [facility]  PHQ-9 Total Score 16

## 2024-03-11 ENCOUNTER — Institutional Professional Consult (permissible substitution): Admitting: Licensed Clinical Social Worker

## 2024-03-25 ENCOUNTER — Ambulatory Visit (INDEPENDENT_AMBULATORY_CARE_PROVIDER_SITE_OTHER): Admitting: Licensed Clinical Social Worker

## 2024-03-25 DIAGNOSIS — F4323 Adjustment disorder with mixed anxiety and depressed mood: Secondary | ICD-10-CM

## 2024-03-25 NOTE — BH Specialist Note (Signed)
 Collaborative Care Initial Assessment   Pt name: Aaron Fields MRN# [id number]   Date: 03/25/24   Session Start time 900 Session End time: 940 Total time in minutes: 40  Encounter Diagnosis  Name Primary?   Adjustment disorder with mixed anxiety and depressed mood Yes    Type of Contact:  IN PERSON   Patient consent obtained:  Yes  Patient and/or legal guardian verbally consented to [facility] services about presenting concerns and psychiatric consultation as appropriate.  The services will be billed as appropriate for the patient   Types of Service: Comprehensive Clinical Assessment (CCA) and Collaborative care  Summary  Aaron Fields is a 38 y.o. male with history of depression, anxiety, attention concerns seen in consultation at the request of [name] MD for establishment of [facility] management.   Pt is currently taking the following psychiatric medications: none . Pt reports that he has taken Wellbutrin and Lexapro  in the past and was not happy with either one.  Current symptoms include: low energy, low levels of concentration, mild anxiety, some restlessness/worrying. Pt states he feels his inattention symptoms are impacting his overall work performance for over one year now. Pt denies SI, HI, or AVH at time of session. Pt denies substance use.   Reason for referral in patient/family's own words:  My attention issues are impacting my work quality Its like something shut off in my brain a year ago  Patient's goal for today's visit: Establish [facility] Collaborative Care  History of Present illness:    History of present illness:  Aaron Fields reports that they have a history of anxiety, depression, attention deficits for the past year and have had the following treatments: trials of wellbutrin and lexapro  that were both unsuccessful. Pt reports that the wellbutrin worked okay for a while but stopped working and that the  lexapro  had bad side effects including increased appetite triggering overeating (pt worked hard to lose weight prior to going on that medication).. Pt reports that he started a new role at his job, after successfully graduating with his MBA. Pt reports that he feels a disconnect between his brain and verbal and written output. Pt reports that a colleague shared that she takes a medication for her ADHD and pt feels that he will have similar outcomes if he tries the same kind of extended release medication that she takes. Pt reports no additional concerns about medical history at time of session.  Pt reports that current external stressors include work related stress, conflict between pts wife and mother.  Pt feels that symptoms of stress, depression, and anxiety are impacting everyday functioning including sleep quality/quantity, and ability to engage with tasks both inside and outside of the home (mostly home activities and work-related tasks). Aaron Fields reports that his wife is primary support system at time of assessment.   Pt feels medication management of his attention-deficit symptoms would be something to assist in their overall symptom management.   Clinical Assessments (PHQ-9 and GAD-7)  PHQ-9 Assessments:     03/25/2024   10:31 AM 03/06/2024    9:28 AM 02/06/2024    9:36 AM  Depression screen PHQ 2/9  Decreased Interest 1 2 2   Down, Depressed, Hopeless 1 1 2   PHQ - 2 Score 2 3 4   Altered sleeping 2 2 1   Tired, decreased energy 2 3 3   Change in appetite 2 3 2   Feeling bad or failure about yourself  0 1 2  Trouble concentrating  3 3 3   Moving slowly or fidgety/restless 1 1 3   Suicidal thoughts 0 0 0  PHQ-9 Score 12 16  18    Difficult doing work/chores Extremely dIfficult Somewhat difficult Somewhat difficult     Data saved with a previous flowsheet row definition     GAD-7 Assessments:     03/25/2024   10:31 AM 03/06/2024    9:28 AM 02/06/2024    9:37 AM  GAD 7 :  Generalized Anxiety Score  Nervous, Anxious, on Edge 2 1 1   Control/stop worrying 2 1 2   Worry too much - different things 2 1 2   Trouble relaxing 2 1 1   Restless 1 1 0  Easily annoyed or irritable 2 2 2   Afraid - awful might happen 1 2 2   Total GAD 7 Score 12 9 10   Anxiety Difficulty Very difficult Extremely difficult Extremely difficult     Self care: fishing, audiobooks, movies, sports, no alcohol.   Processing issues bboth speaking and writing.   Social History:  Household:  Patient, wife, children. Pts mother lives in [place]. 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 Go to bed early and wake up early (5:30 am). Wake up 1-2 times most nights.  Changes in appetite: none recently Decreased need for sleep: No--feels tired and low energy with inconsistent sleep nights. Family history of bipolar disorder: No   mom--anxiety .  Hallucinations: No   Paranoia: No    Psychotropic medications: none  Current medications: No current outpatient medications on file prior to visit.   No current facility-administered medications on file prior to visit.     Patient taking medications as prescribed:  No--pt is not taking any medication currently Side effects reported: Yes--pt reports that he did not like the increased appetite/overeating that happened when pt was prescribed Lexapro  and that was one of the main reasons that he discontinued it   Psychiatric History  Have you ever been treated for a mental health problem? Yes If Yes, when were you treated and whom did you see (psychiatrist/counselor) ?     When n/a       Name of provider PCP    Psychiatric History  Depression: Yes--mild sxs of depression Anxiety: Yes--mild sxs of anxiety  Mania: No Psychosis: No PTSD symptoms: No  Past  Psychiatric History/Hospitalization(s): Hospitalization for psychiatric illness: No Prior Suicide Attempts: No Prior Self-injurious behavior: No  Have you ever had thoughts of harming yourself or others or attempted suicide? No plan to harm self or others  Traumatic Experiences: History or current traumatic events (natural disaster, house fire, etc.)? Yes--loss of father 10 years ago History or current physical trauma?  no History or current emotional trauma?  Yes--conflict between wife and his mother History or current sexual trauma?  no History or current domestic or intimate partner violence?  no   Alcohol and/or Substance Use History   Tobacco Alcohol Other substances  Current use Pt denies (AUDIT-C screening) Rare ETOH Pt denies  Past use Pt denies Pt denies Pt denies  Past treatment Pt denies Pt denies Pt denies      Withdrawal Potential: none  [place] Suicide Severity Rating Scale:   Psychologist, Occupational Health from 03/25/2024 in [facility]  C-SSRS RISK CATEGORY No Risk    Guns in the home (secured):  no   The patient demonstrates the following risk factors for suicide: Chronic risk factors for suicide include: N/A. Acute risk factors for suicide include: N/A. Protective factors for this patient include: positive social support, responsibility to others (children, family), coping skills, hope for the future, and life satisfaction. Considering these factors, the overall suicide risk at this point appears to be low. Patient is appropriate for outpatient follow up.  Danger to Others Risk Assessment Danger to others risk factors:  NONE Patient endorses recent thoughts of harming others:  Pt denies Dynamic Appraisal of Situational Aggression (DASA): NONE  BH Counselor discussed emergency crisis plan with client and provided local emergency services resources.  Mental status exam:   General Appearance Siegfried:  Neat Eye  Contact:  Good Motor Behavior:  Normal Speech:  Normal Level of Consciousness:  Alert Mood:  NA Affect:  Appropriate Anxiety Level:  None Thought Process:  Coherent Thought Content:  WNL Perception:  Normal Judgment:  Good Insight:  Present  Diagnosis: Encounter Diagnosis  Name Primary?   Adjustment disorder with mixed anxiety and depressed mood Yes      Goals: Increase healthy adjustment to current life circumstances   Interventions: Solution-Focused Strategies   Follow-up Plan: [facility] Collaborative Care team and potential referrals (neuropsychological testing) PRN.  Tawni SAUNDERS Alaynah Schutter, LCSW  Assessment completed by Tawni Brisker, MSW, LCSW  on 03/25/24

## 2024-03-26 NOTE — Patient Instructions (Signed)
 TIPS TO IMPROVE ORGANIZATION, TIME MANAGEMENT, AND GOAL SETTING   ?? 1. Use External Supports Calendars & Planners: Use digital or paper planners to track appointments, deadlines, and tasks. Reminders & Alarms: Set phone alerts for important tasks or transitions. Checklists: Break tasks into steps and check them off as you go.   ?? 2. Break Tasks into Smaller Steps Large tasks can feel overwhelming. Break them into bite-sized actions. Use the "First, Next, Then" method to stay focused.   ? 3. Practice Time Management Use the Pomodoro Technique (25 minutes work, 5 minutes break). Estimate how long tasks will take and compare to actual time. Schedule buffer time between tasks to reduce stress.   ?? 4. Strengthen Emotional Regulation Practice mindfulness or deep breathing to manage frustration or impulsivity. Use journaling to reflect on emotional triggers and responses. Pause before reacting--ask, "What's the best next step?"   ??? 5. Improve Organization Create designated spaces for items (keys, documents, etc.). Use color-coding or labels for files and folders. Declutter regularly--physical clutter can increase mental clutter.   ?? 6. Boost Working Memory Repeat information aloud or write it down. Use mnemonics or visual imagery to remember details. Practice memory games or apps like Lumosity or Elevate.   ?? 7. Build Routines Consistent routines reduce the need for constant decision-making. Start with morning and evening routines. Anchor new habits to existing ones (e.g., take meds after brushing teeth).   ?? 8. Set Clear Goals Use SMART goals (Specific, Measurable, Achievable, Relevant, Time-bound). Review goals weekly and adjust as needed. Celebrate small wins to build momentum.  _______________________  Using Behavioral Activation to manage stress/depression symptoms    Identify/understand your own mood triggers.   Structure your day--get up around the  same time, eat meals/snacks around the same time, go to bed around the same time.   Purposefully schedule self care time and time to complete tasks. This can include quiet time  Stimulate your brain--go for a walk, text/call a friend or family member, if you are indoors--go outside (and vice versa), go for a drive, go to a store with bright colors and bright lights. Try to do things in a different way--drive to your favorite places using an alternative route, or instead of starting on the right side of the grocery store when shopping, start on the left side. You might feel a bit uncomfortable doing things outside of the comfort zone, but this is helping the brain create new neural pathways and is very healthy for brain/emotional health.   Physical movement based on your ability. If you can go for a walk, do stretches, even waving your hands to music can trigger feel-good endorphins in the brain and help release physical tension we all hold in our bodies.  Even 5 minutes can make a difference.   Be intentional about doing things that bring you joy (or used to bring you joy), and look for the things in every day that make you happy.  Seek those glimmers of joy each day.  Set a timer for 5 minutes for a harder task (ex. Laundry, washing dishes).  Allow yourself to work distraction-free for 5 minutes, then stop when the timer goes off. If you need a break, take a break. If you want to continue working then set another timer for whatever time you choose.   Limit or eliminate substance use including alcohol, marijuana, or recreational use of prescription medication.  Let in the light!! Open the window blinds, curtains and let natural light  in. Even sitting near a window or sitting outside can boost your mood, especially in the wintertime when there is less daylight.    Things to envision for ourselves to to improve inspiration, motivation, and initiative :  improving physical wellness, focus on  family relationships, focusing on our own mental/emotional well being, being a part of a bigger community, finding a hobby, being a part of something that fosters personal growth, engaging socially with others (even digitally!!!)    Emergency Resources:  National Suicide & Crisis Lifeline: Call or text 988  Crisis Text Line: Text HOME to 364 804 9632  [facility]  [address] [place], [place] [address] [phone] or [phone] WALK-IN URGENT CARE 24/7 FOR ANYONE [address], [place], [place]  [phone] Fax: [phone] [email or web address] *Interpreters available *Accepts all insurance and uninsured for Urgent Care needs *Accepts Medicaid and uninsured for outpatient treatment (below)

## 2024-03-29 ENCOUNTER — Other Ambulatory Visit: Payer: Self-pay

## 2024-04-02 ENCOUNTER — Telehealth (INDEPENDENT_AMBULATORY_CARE_PROVIDER_SITE_OTHER): Payer: Self-pay | Admitting: Licensed Clinical Social Worker

## 2024-04-02 DIAGNOSIS — F331 Major depressive disorder, recurrent, moderate: Secondary | ICD-10-CM

## 2024-04-02 NOTE — BH Specialist Note (Unsigned)
 Virtual Behavioral Health Treatment Plan Team Note  MRN: [id number] NAME: Aaron Fields  DATE: 04/02/24  Start time:   End time:   Total time:    Total number of Virtual BH Treatment Team Plan encounters: 1/4  Treatment Team Attendees: Tawni Brisker, LCSW and Sharlot Becker, DNP   Diagnoses: No diagnosis found.  Goals, Interventions and Follow-up Plan Goals: Increase healthy adjustment to current life circumstances Interventions: Solution-Focused Strategies  Medication Management Recommendations: ***  Follow-up Plan: [facility] Collaborative Care team and potential referrals (neuropsychological testing) PRN.  History of the present illness Presenting Problem/Current Symptoms: Aaron Fields is a 38 y.o. male with history of depression, anxiety, attention concerns seen in consultation at the request of [name] MD for establishment of [facility] management.   Pt is currently taking the following psychiatric medications: none . Pt reports that he has taken Wellbutrin and Lexapro  in the past and was not happy with either one.  Current symptoms include: low energy, low levels of concentration, mild anxiety, some restlessness/worrying. Pt states he feels his inattention symptoms are impacting his overall work performance for over one year now. Pt denies SI, HI, or AVH at time of session. Pt denies substance use.    Psychiatric History   Have you ever been treated for a mental health problem? Yes If Yes, when were you treated and whom did you see (psychiatrist/counselor) ?     When n/a       Name of provider PCP    Psychiatric History  Depression: Yes--mild sxs of depression Anxiety: Yes--mild sxs of anxiety  Mania: No Psychosis: No PTSD symptoms: No   Past Psychiatric History/Hospitalization(s): Hospitalization for psychiatric illness: No Prior Suicide Attempts: No Prior Self-injurious behavior: No   Have you ever had thoughts of harming yourself or others  or attempted suicide? No plan to harm self or others  Psychosocial stressors Flowsheet Row Integrated Behavioral Health from 03/25/2024 in [facility]  Current Stressors Family conflict, Other (Comment)  [work related stress]  Familial Stressors None  Sleep Difficulty staying asleep  [wakes up 1-2 times per night]  Appetite No problems, Increased, Weight gain  [hx of increased appetite and weight gain while taking Lexapro  which is why pt discontinued]  Coping ability Overwhelmed  Patient taking medications as prescribed No prescribed medications    Self-harm Behaviors Risk Assessment Flowsheet Row Integrated Behavioral Health from 03/25/2024 in [facility]  Have you recently had any thoughts about harming yourself? No    Screenings PHQ-9 Assessments:     03/25/2024   10:31 AM 03/06/2024    9:28 AM 02/06/2024    9:36 AM  Depression screen PHQ 2/9  Decreased Interest 1 2 2   Down, Depressed, Hopeless 1 1 2   PHQ - 2 Score 2 3 4   Altered sleeping 2 2 1   Tired, decreased energy 2 3 3   Change in appetite 2 3 2   Feeling bad or failure about yourself  0 1 2  Trouble concentrating 3 3 3   Moving slowly or fidgety/restless 1 1 3   Suicidal thoughts 0 0 0  PHQ-9 Score 12 16  18    Difficult doing work/chores Extremely dIfficult Somewhat difficult Somewhat difficult     Data saved with a previous flowsheet row definition   GAD-7 Assessments:     03/25/2024   10:31 AM 03/06/2024    9:28 AM 02/06/2024    9:37 AM  GAD 7 : Generalized Anxiety Score  Nervous, Anxious,  on Edge 2 1 1   Control/stop worrying 2 1 2   Worry too much - different things 2 1 2   Trouble relaxing 2 1 1   Restless 1 1 0  Easily annoyed or irritable 2 2 2   Afraid - awful might happen 1 2 2   Total GAD 7 Score 12 9 10   Anxiety Difficulty Very difficult Extremely difficult Extremely difficult    Past Medical History No past medical history on file.  Vital  signs: There were no vitals filed for this visit.  Allergies:  Allergies as of 04/02/2024 - Review Complete 03/06/2024  Allergen Reaction Noted   Ceclor [cefaclor] Rash 10/26/2016    Medication History Current medications:  No outpatient encounter medications on file as of 04/02/2024.   No facility-administered encounter medications on file as of 04/02/2024.     Scribe for Treatment Team: Sabah Zucco R Lynden Carrithers, LCSW

## 2024-04-03 DIAGNOSIS — F321 Major depressive disorder, single episode, moderate: Secondary | ICD-10-CM

## 2024-04-03 DIAGNOSIS — F419 Anxiety disorder, unspecified: Secondary | ICD-10-CM

## 2024-04-03 DIAGNOSIS — R41840 Attention and concentration deficit: Secondary | ICD-10-CM

## 2024-04-09 MED ORDER — BUPROPION HCL ER (XL) 150 MG PO TB24: 150.0000 mg | ORAL_TABLET | Freq: Every day | ORAL | 3 refills | Status: AC

## 2024-04-09 MED ORDER — GABAPENTIN 100 MG PO CAPS: 100.0000 mg | ORAL_CAPSULE | Freq: Two times a day (BID) | ORAL | 3 refills | Status: AC | PRN

## 2024-05-24 ENCOUNTER — Ambulatory Visit: Admitting: Licensed Clinical Social Worker

## 2024-05-24 NOTE — BH Specialist Note (Signed)
 [facility] Collaborative Care Clinician attempted to connect with Aaron Fields for an appointment scheduled 05/24/2024 at 10:30 AM EST via [facility] (EPIC/MyChart).  Pt was sent invitation to connect via text and email x 2.  Pt did not connect in virtual room--after 15 minutes, clinician closed out [facility] (EPIC/MyChart) virtual room.     Clinician spent total of 15 minutes attempting to connect with patient for scheduled visit.
